# Patient Record
Sex: Male | Born: 1950 | Hispanic: No | Smoking: Former smoker
Health system: Southern US, Community
[De-identification: ages and names within clinical notes are randomized; demographics above are authoritative.]

## PROBLEM LIST (undated history)

## (undated) DIAGNOSIS — I10 Essential (primary) hypertension: Secondary | ICD-10-CM

## (undated) HISTORY — PX: CHOLECYSTECTOMY: SHX55

---

## 1999-12-27 ENCOUNTER — Other Ambulatory Visit: Admission: RE | Admit: 1999-12-27 | Discharge: 1999-12-27 | Payer: Self-pay | Admitting: *Deleted

## 2016-12-20 ENCOUNTER — Emergency Department (HOSPITAL_COMMUNITY): Payer: BC Managed Care – PPO

## 2016-12-20 ENCOUNTER — Encounter (HOSPITAL_COMMUNITY): Payer: Self-pay | Admitting: Emergency Medicine

## 2016-12-20 ENCOUNTER — Emergency Department (HOSPITAL_COMMUNITY)
Admission: EM | Admit: 2016-12-20 | Discharge: 2016-12-20 | Disposition: A | Discharge status: Home / Self Care | Payer: BC Managed Care – PPO | Attending: Emergency Medicine | Admitting: Emergency Medicine

## 2016-12-20 DIAGNOSIS — I10 Essential (primary) hypertension: Secondary | ICD-10-CM | POA: Insufficient documentation

## 2016-12-20 DIAGNOSIS — R61 Generalized hyperhidrosis: Secondary | ICD-10-CM | POA: Insufficient documentation

## 2016-12-20 DIAGNOSIS — Z79899 Other long term (current) drug therapy: Secondary | ICD-10-CM | POA: Insufficient documentation

## 2016-12-20 DIAGNOSIS — R200 Anesthesia of skin: Secondary | ICD-10-CM | POA: Diagnosis not present

## 2016-12-20 DIAGNOSIS — R251 Tremor, unspecified: Secondary | ICD-10-CM | POA: Insufficient documentation

## 2016-12-20 DIAGNOSIS — R55 Syncope and collapse: Secondary | ICD-10-CM | POA: Diagnosis present

## 2016-12-20 DIAGNOSIS — Z7982 Long term (current) use of aspirin: Secondary | ICD-10-CM | POA: Insufficient documentation

## 2016-12-20 HISTORY — DX: Essential (primary) hypertension: I10

## 2016-12-20 LAB — BASIC METABOLIC PANEL
Anion gap: 8 (ref 5–15)
BUN: 15 mg/dL (ref 6–20)
CALCIUM: 9.1 mg/dL (ref 8.9–10.3)
CO2: 26 mmol/L (ref 22–32)
CREATININE: 1.53 mg/dL — AB (ref 0.61–1.24)
Chloride: 100 mmol/L — ABNORMAL LOW (ref 101–111)
GFR, EST AFRICAN AMERICAN: 54 mL/min — AB (ref >60)
GFR, EST NON AFRICAN AMERICAN: 46 mL/min — AB (ref >60)
Glucose, Bld: 101 mg/dL — ABNORMAL HIGH (ref 65–99)
Potassium: 4.3 mmol/L (ref 3.5–5.1)
SODIUM: 134 mmol/L — AB (ref 135–145)

## 2016-12-20 LAB — CBC WITH DIFFERENTIAL/PLATELET
BASOS ABS: 0.1 10*3/uL (ref 0.0–0.1)
BASOS PCT: 1 %
EOS ABS: 0.2 10*3/uL (ref 0.0–0.7)
Eosinophils Relative: 4 %
HCT: 44.5 % (ref 39.0–52.0)
HEMOGLOBIN: 15.5 g/dL (ref 13.0–17.0)
Lymphocytes Relative: 30 %
Lymphs Abs: 1.7 10*3/uL (ref 0.7–4.0)
MCH: 31.9 pg (ref 26.0–34.0)
MCHC: 34.8 g/dL (ref 30.0–36.0)
MCV: 91.6 fL (ref 78.0–100.0)
MONOS PCT: 10 %
Monocytes Absolute: 0.6 10*3/uL (ref 0.1–1.0)
NEUTROS PCT: 55 %
Neutro Abs: 3.3 10*3/uL (ref 1.7–7.7)
Platelets: 221 10*3/uL (ref 150–400)
RBC: 4.86 MIL/uL (ref 4.22–5.81)
RDW: 13.7 % (ref 11.5–15.5)
WBC: 5.9 10*3/uL (ref 4.0–10.5)

## 2016-12-20 LAB — URINALYSIS, ROUTINE W REFLEX MICROSCOPIC
BILIRUBIN URINE: NEGATIVE
Glucose, UA: NEGATIVE mg/dL
HGB URINE DIPSTICK: NEGATIVE
Ketones, ur: NEGATIVE mg/dL
Leukocytes, UA: NEGATIVE
Nitrite: NEGATIVE
PROTEIN: NEGATIVE mg/dL
Specific Gravity, Urine: 1.008 (ref 1.005–1.030)
pH: 6 (ref 5.0–8.0)

## 2016-12-20 LAB — CBG MONITORING, ED: GLUCOSE-CAPILLARY: 101 mg/dL — AB (ref 65–99)

## 2016-12-20 LAB — TROPONIN I

## 2016-12-20 LAB — ETHANOL

## 2016-12-20 MED ORDER — SODIUM CHLORIDE 0.9 % IV BOLUS (SEPSIS)
1000.0000 mL | Freq: Once | INTRAVENOUS | Status: AC
  Administered 2016-12-20: 1000 mL via INTRAVENOUS

## 2016-12-20 MED ORDER — SODIUM CHLORIDE 0.9 % IV SOLN: INTRAVENOUS | Status: DC

## 2016-12-20 NOTE — Discharge Instructions (Signed)
Stop hydrochlorothiazide

## 2016-12-20 NOTE — ED Triage Notes (Signed)
Pt here from MD office with c/o near syncopal episode no chest pain or sob , cbg 102, pt had a full syncopal this past sat night , no c/o pain at this time

## 2016-12-20 NOTE — ED Provider Notes (Signed)
[facility]-EMERGENCY DEPT Provider Note   CSN: [id number] Arrival date & time: 12/20/16  1052     History   Chief Complaint Chief Complaint  Patient presents with  . Near Syncope    HPI George Chaney is a 66 y.o. male.  Pt presents to the ED today with a syncopal episode that occurred a few days ago and a near syncopal episode that occurred today.  The pt said that his full syncopal episode occurred after coughing.  Today, he woke up and did not feel well.  He said he started sweating.  He had a flight to take today and got ready and drove to the airport.  While there, he sweated through his shirt.  He was also incontinent of urine (not usual for pt).  He said he continued to feel worse and worse and decided not to get on the plane.  Instead, he drove to his doctor's office.  His doctor called EMS.  The pt said he feels ok now.  He does note that his pcp has been trying to get his bp under control.  His meds were recently doubled and a fluid pill was ordered.  The pt has been urinating a lot since the fluid pill was ordered.  Pt also notes that he's had some tingling to his left 4th and 5th fingers, but that has been going on for awhile.  Pt's brother just had a CABG a few weeks ago.  Pt's wife has noticed that he's had some twitching on his right leg.      Past Medical History:  Diagnosis Date  . Hypertension     There are no active problems to display for this patient.   Past Surgical History:  Procedure Laterality Date  . CHOLECYSTECTOMY         Home Medications    Prior to Admission medications   Medication Sig Start Date End Date Taking? Authorizing Provider  amitriptyline (ELAVIL) 25 MG tablet Take 25 mg by mouth at bedtime. 12/01/16  Yes [provider]  aspirin EC 81 MG tablet Take 81 mg by mouth every morning.   Yes [provider]  atorvastatin (LIPITOR) 10 MG tablet Take 10 mg by mouth every morning. 11/11/16  Yes [provider]    budesonide-formoterol (SYMBICORT) 80-4.5 MCG/ACT inhaler Inhale 2 puffs into the lungs 2 (two) times daily as needed (shortness of breath).   Yes [provider]  escitalopram (LEXAPRO) 10 MG tablet Take 10 mg by mouth every morning. 10/18/16  Yes [provider]  esomeprazole (NEXIUM) 40 MG capsule Take 80 mg by mouth every morning. 11/17/16  Yes [provider]  fenofibrate (TRICOR) 48 MG tablet Take 48 mg by mouth every morning. 12/11/16  Yes [provider]  fluticasone (FLONASE) 50 MCG/ACT nasal spray Place 2 sprays into both nostrils daily as needed for allergies or rhinitis.   Yes [provider]  hydrochlorothiazide (HYDRODIURIL) 25 MG tablet Take 25 mg by mouth every morning. 11/14/16  Yes [provider]  montelukast (SINGULAIR) 10 MG tablet Take 10 mg by mouth at bedtime. 11/17/16  Yes [provider]  ramipril (ALTACE) 10 MG capsule Take 10 mg by mouth every morning. 10/27/16  Yes [provider]  testosterone (ANDROGEL) 50 MG/5GM (1%) GEL Place 2 g onto the skin every morning. 09/29/16  Yes [provider]  tolterodine (DETROL LA) 4 MG 24 hr capsule Take 4 mg by mouth daily. 11/30/16  Yes [provider]  albuterol (PROVENTIL HFA;VENTOLIN HFA) 108 (90 Base) MCG/ACT inhaler Inhale 2 puffs into the lungs every 6 (six) hours as needed for wheezing or shortness of breath.    [provider]    Family History History reviewed. No pertinent family history.  Social History Social History  Substance Use Topics  . Smoking status: Former Games developer  . Smokeless tobacco: Never Used  . Alcohol use Yes     Allergies   Bee venom   Review of Systems Review of Systems  Constitutional: Positive for diaphoresis.  Neurological: Positive for tremors, syncope and numbness.  All other systems reviewed and are negative.    Physical Exam Updated Vital Signs BP (!) 154/84   Pulse 71   Resp 18   SpO2  99%   Physical Exam  Constitutional: He is oriented to person, place, and time. He appears well-developed and well-nourished.  HENT:  Head: Normocephalic and atraumatic.  Right Ear: External ear normal.  Left Ear: External ear normal.  Nose: Nose normal.  Mouth/Throat: Oropharynx is clear and moist.  Eyes: Pupils are equal, round, and reactive to light. Conjunctivae and EOM are normal.  Neck: Normal range of motion. Neck supple.  Cardiovascular: Normal rate, regular rhythm, normal heart sounds and intact distal pulses.   Pulmonary/Chest: Effort normal and breath sounds normal.  Abdominal: Soft. Bowel sounds are normal.  Musculoskeletal: Normal range of motion.  Neurological: He is alert and oriented to person, place, and time.  Skin: Skin is warm.  Psychiatric: He has a normal mood and affect. His behavior is normal. Judgment and thought content normal.  Nursing note and vitals reviewed.    ED Treatments / Results  Labs (all labs ordered are listed, but only abnormal results are displayed) Labs Reviewed  BASIC METABOLIC PANEL - Abnormal; Notable for the following:       Result Value   Sodium 134 (*)    Chloride 100 (*)    Glucose, Bld 101 (*)    Creatinine, Ser 1.53 (*)    GFR calc non Af Amer 46 (*)    GFR calc Af Amer 54 (*)    All other components within normal limits  URINALYSIS, ROUTINE W REFLEX MICROSCOPIC - Abnormal; Notable for the following:    Color, Urine STRAW (*)    All other components within normal limits  CBG MONITORING, ED - Abnormal; Notable for the following:    Glucose-Capillary 101 (*)    All other components within normal limits  CBC WITH DIFFERENTIAL/PLATELET  TROPONIN I  ETHANOL    EKG  EKG Interpretation  Date/Time:  Tuesday December 20 2016 11:07:20 EDT Ventricular Rate:  72 PR Interval:  154 QRS Duration: 92 QT Interval:  384 QTC Calculation: 420 R Axis:   -2 Text Interpretation:  Normal sinus rhythm Normal ECG Confirmed by Jacalyn Lefevre [phone]) on 12/20/2016 11:13:22 AM       Radiology Dg Chest 2 View  Result Date: 12/20/2016 CLINICAL DATA:  Near syncopal episode. Full syncopal episode 3 nights ago. No chest complaints or shortness of breath. Former smoker. History of hypertension. EXAM: CHEST  2 VIEW COMPARISON:  Report of a chest x-ray of October 15, 2011 FINDINGS: The lungs are adequately inflated. The interstitial markings are mildly prominent. There is no alveolar infiltrate, pleural effusion, or pneumothorax. The heart is top-normal in size. The pulmonary vascularity is not engorged. The mediastinum is normal in width. The bony thorax is unremarkable. IMPRESSION: There is no acute cardiopulmonary abnormality. Coarse interstitial  lung markings likely reflect the patient's smoking history. Electronically Signed   By: David  [place] M.D.   On: 12/20/2016 12:19   Ct Head Wo Contrast  Result Date: 12/20/2016 CLINICAL DATA:  Onset of altered level of consciousness today. EXAM: CT HEAD WITHOUT CONTRAST TECHNIQUE: Contiguous axial images were obtained from the base of the skull through the vertex without intravenous contrast. COMPARISON:  None. FINDINGS: Brain: Appears normal without hemorrhage, infarct, mass lesion, mass effect, midline shift or abnormal extra-axial fluid collection. No hydrocephalus or pneumocephalus. Vascular: Atherosclerosis noted. Skull: Intact Sinuses/Orbits: The patient is status post maxillary antrostomy and ethmoidectomy. Small mucous retention cyst or polyp left maxillary sinus noted. Tiny amount of fluid is seen in the right mastoid air cells. Other: Small area of increased attenuation in the soft tissues of the scalp posteriorly near the vertex on the right may be due to contusion. IMPRESSION: Questions scalp contusion posteriorly near the vertex on the right. Negative for fracture or acute intracranial abnormality. Atherosclerosis. Electronically Signed   By: Drusilla Kanner M.D.   On: 12/20/2016 12:12     Procedures Procedures (including critical care time)  Medications Ordered in ED Medications  sodium chloride 0.9 % bolus 1,000 mL (0 mLs Intravenous Stopped 12/20/16 1308)    And  0.9 %  sodium chloride infusion (not administered)     Initial Impression / Assessment and Plan / ED Course  I have reviewed the triage vital signs and the nursing notes.  Pertinent labs & imaging results that were available during my care of the patient were reviewed by me and considered in my medical decision making (see chart for details).    Pt looks good.  I think sx are from his diuretic.  Pt wants to go home.  Pt needs to f/u with pcp and with a cardiologist.  He is aware he needs a cardiac work up.  Pt knows to return for cp or any worsening of sx.  Final Clinical Impressions(s) / ED Diagnoses   Final diagnoses:  Syncope, unspecified syncope type    New Prescriptions New Prescriptions   No medications on file     Jacalyn Lefevre, MD 12/20/16 1338

## 2016-12-20 NOTE — ED Notes (Signed)
Patient transported to X-ray 

## 2016-12-20 NOTE — ED Notes (Signed)
Pt has said that he has been having some memory loss recently and c/o right hand tingling on the last three fingers

## 2016-12-22 DIAGNOSIS — R55 Syncope and collapse: Secondary | ICD-10-CM | POA: Insufficient documentation

## 2016-12-23 ENCOUNTER — Ambulatory Visit (INDEPENDENT_AMBULATORY_CARE_PROVIDER_SITE_OTHER): Payer: BC Managed Care – PPO | Admitting: Interventional Cardiology

## 2016-12-23 ENCOUNTER — Encounter: Payer: Self-pay | Admitting: Interventional Cardiology

## 2016-12-23 VITALS — BP 136/78 | HR 75 | Ht 70.0 in | Wt 239.6 lb

## 2016-12-23 DIAGNOSIS — R0609 Other forms of dyspnea: Secondary | ICD-10-CM

## 2016-12-23 DIAGNOSIS — R251 Tremor, unspecified: Secondary | ICD-10-CM

## 2016-12-23 DIAGNOSIS — Z7289 Other problems related to lifestyle: Secondary | ICD-10-CM | POA: Insufficient documentation

## 2016-12-23 DIAGNOSIS — R55 Syncope and collapse: Secondary | ICD-10-CM | POA: Diagnosis not present

## 2016-12-23 DIAGNOSIS — Z789 Other specified health status: Secondary | ICD-10-CM

## 2016-12-23 DIAGNOSIS — E782 Mixed hyperlipidemia: Secondary | ICD-10-CM | POA: Diagnosis not present

## 2016-12-23 NOTE — Progress Notes (Signed)
Cardiology Office Note   Date:  12/23/2016   ID:  TRYTON BODI, DOB 09-03-1950, MRN [id number]  PCP:  Malka So., MD    Chief Complaint  Patient presents with  . New Patient (Initial Visit)   syncope  Wt Readings from Last 3 Encounters:  12/23/16 239 lb 9.6 oz (108.7 kg)       History of Present Illness: George Chaney is a 66 y.o. male who is being seen today for the evaluation of syncope at the request of Jobe, Garrison Columbus., MD.   He has HTN.  His BP meds have been increased over the last few weeks due to increased readings.  Last change was HCTZ being added.  After this, he had a syncope spell  4 weeks ago.  He was sitting down at the time on a vacation.  He was in a hot tub with 2 physicians.  He passed out after choking on some alcohol.  A few days ago on Sunday, he had another episode while he was sitting.    He could tell something was not right.  He was under stress.  On Tuesday, he felt hot and got very sweaty while on his way to the airport.  He lost control of his bowels.  He had shaking and tingling.  He decided not to get on the plane. He went to an urgent care.  BP was controlled initially.  BP then increased suddenly.  It was recommended that he go to the hospital.  He went by ambulance to [facility].  He had some short term memory loss as well.  ER told him it was related to BP fluctuation.  HCTZ was stopped.    On other occasions, there is no warning.  He feels that they are more frequent.  Typically, his syncopal episodes are related to coughing spells and have frequently involved alcohol.  THere was an episode 4 years ago as well.    He feels SHOB.  THis is getting worse with exeriton.  His brother had CABG recently.  He gets dizzy with standing quickly.    He has had fatigue and is treated for OSA with CPAP. This was recently readjusted.   Brother had CABG after presenting with syncope.   He does not smoke.  He admits to consuming 3 glasses of wine  daily, but states that he "is not a drunk."    Sex is his most strenuous activity.  He is not on any regular exercise program.     Past Medical History:  Diagnosis Date  . Hypertension     Past Surgical History:  Procedure Laterality Date  . CHOLECYSTECTOMY       Current Outpatient Prescriptions  Medication Sig Dispense Refill  . albuterol (PROVENTIL HFA;VENTOLIN HFA) 108 (90 Base) MCG/ACT inhaler Inhale 2 puffs into the lungs every 6 (six) hours as needed for wheezing or shortness of breath.    Marland Kitchen amitriptyline (ELAVIL) 25 MG tablet Take 25 mg by mouth at bedtime.  11  . aspirin EC 81 MG tablet Take 81 mg by mouth every morning.    Marland Kitchen atorvastatin (LIPITOR) 10 MG tablet Take 10 mg by mouth every morning.  1  . budesonide-formoterol (SYMBICORT) 80-4.5 MCG/ACT inhaler Inhale 2 puffs into the lungs 2 (two) times daily as needed (shortness of breath).    . escitalopram (LEXAPRO) 10 MG tablet Take 10 mg by mouth every morning.  1  . esomeprazole (NEXIUM) 40 MG capsule Take  80 mg by mouth every morning.  1  . fenofibrate (TRICOR) 48 MG tablet Take 48 mg by mouth every morning.  6  . fluticasone (FLONASE) 50 MCG/ACT nasal spray Place 2 sprays into both nostrils daily as needed for allergies or rhinitis.    Marland Kitchen montelukast (SINGULAIR) 10 MG tablet Take 10 mg by mouth at bedtime.  1  . ramipril (ALTACE) 10 MG capsule Take 10 mg by mouth every morning.  3  . testosterone (ANDROGEL) 50 MG/5GM (1%) GEL Place 2 g onto the skin every morning.  0  . tolterodine (DETROL LA) 4 MG 24 hr capsule Take 4 mg by mouth daily.  3   No current facility-administered medications for this visit.     Allergies:   Bee venom    Social History:  The patient  reports that he has quit smoking. He has never used smokeless tobacco. He reports that he drinks alcohol. He reports that he does not use drugs.   Family History:  The patient's family history includes Heart disease in his brother.    ROS:  Please see  the history of present illness.   Otherwise, review of systems are positive for syncope, intermittent tremor of foot.   All other systems are reviewed and negative.    PHYSICAL EXAM: VS:  BP 136/78   Pulse 75   Ht 5\' 10"  (1.778 m)   Wt 239 lb 9.6 oz (108.7 kg)   BMI 34.38 kg/m  , BMI Body mass index is 34.38 kg/m. GEN: Well nourished, well developed, in no acute distress ; markedly reddish complexion HEENT: normal  Neck: no JVD, carotid bruits, or masses Cardiac: RRR; no murmurs, rubs, or gallops,no edema  Respiratory:  clear to auscultation bilaterally, normal work of breathing GI: soft, nontender, nondistended, + BS MS: no deformity or atrophy  Skin: warm and dry, no rash, markedly reddish complexion Neuro:  Strength and sensation are intact Psych: euthymic mood, full affect   EKG:   The ekg ordered today demonstrates NSR, no ST segment changes   Recent Labs: 12/20/2016: BUN 15; Creatinine, Ser 1.53; Hemoglobin 15.5; Platelets 221; Potassium 4.3; Sodium 134   Lipid Panel No results found for: CHOL, TRIG, HDL, CHOLHDL, VLDL, LDLCALC, LDLDIRECT   Other studies Reviewed: Additional studies/ records that were reviewed today with results demonstrating: Mild renal insufficiency based on labs drawn in the ER..   ASSESSMENT AND PLAN:  1. Syncope: Coughing has been a part of most of his syncopal episodes.  He likely gets hypotensive from the straining.  In addition, alcohol has been associated as well. This likely contributes to vasodilation, and hypotension which predisposes to syncope. Check echo to r/o structural heart disease.   He was told not to drive already by urgent care.  2. Alcohol use:  I asked him to cut own on his alcohol use to one glass of wine daily.  Diuretic effect of alcohol may also predispose him to syncope.  Of note, when I asked him routine yes/no questions about tobacco and alcohol, his initial response included admitting to alcohol use but insisting that he  is "not a drunk."   3. DOE:  Plan for nuclear stress test.  He needs a more regular exercise program longterm.  Will check for ischemia. Several RF for CAD including family history, hyperlipidemia and obesity.   4. Tremor: Followup with PCP.  Question whether this is related to alcohol use as well.   5. Hyperlipidemia: COntinue fenofibrate. 6. 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    Current medicines are reviewed at length with the patient today.  The patient concerns regarding his medicines were addressed.  The following changes have been made:  No change  Labs/ tests ordered today include:   Orders Placed This Encounter  Procedures  . Myocardial Perfusion Imaging  . ECHOCARDIOGRAM COMPLETE    Recommend 150 minutes/week of aerobic exercise Low fat, low carb, high fiber diet recommended  Disposition:   FU in based on test results   Signed, Lance Muss, MD  12/23/2016 1:15 PM    [facility] [address] [place], [place], [place]  [address] Phone: [phone]; Fax: [phone]

## 2016-12-23 NOTE — Patient Instructions (Signed)
Medication Instructions:  Your physician recommends that you continue on your current medications as directed. Please refer to the Current Medication list given to you today.   Labwork: None ordered  Testing/Procedures: Your physician has requested that you have an echocardiogram. Echocardiography is a painless test that uses sound waves to create images of your heart. It provides your doctor with information about the size and shape of your heart and how well your heart's chambers and valves are working. This procedure takes approximately one hour. There are no restrictions for this procedure.  Your physician has requested that you have en exercise stress myoview. For further information please visit [email or web address]. Please follow instruction sheet, as given.   Follow-Up: Based on test results   Any Other Special Instructions Will Be Listed Below (If Applicable).     If you need a refill on your cardiac medications before your next appointment, please call your pharmacy.   

## 2016-12-26 ENCOUNTER — Telehealth (HOSPITAL_COMMUNITY): Payer: Self-pay | Admitting: *Deleted

## 2016-12-26 NOTE — Telephone Encounter (Signed)
Patient given detailed instructions per Myocardial Perfusion Study Information Sheet for the test on 12/28/16 Patient notified to arrive 15 minutes early and that it is imperative to arrive on time for appointment to keep from having the test rescheduled.  If you need to cancel or reschedule your appointment, please call the office within 24 hours of your appointment. . Patient verbalized understanding. Ricky Ala, RN

## 2016-12-28 ENCOUNTER — Other Ambulatory Visit: Payer: Self-pay

## 2016-12-28 ENCOUNTER — Ambulatory Visit (HOSPITAL_COMMUNITY): Payer: BC Managed Care – PPO | Attending: Cardiology

## 2016-12-28 ENCOUNTER — Ambulatory Visit (HOSPITAL_BASED_OUTPATIENT_CLINIC_OR_DEPARTMENT_OTHER): Payer: BC Managed Care – PPO

## 2016-12-28 DIAGNOSIS — R55 Syncope and collapse: Secondary | ICD-10-CM

## 2016-12-28 DIAGNOSIS — E785 Hyperlipidemia, unspecified: Secondary | ICD-10-CM | POA: Insufficient documentation

## 2016-12-28 DIAGNOSIS — I080 Rheumatic disorders of both mitral and aortic valves: Secondary | ICD-10-CM | POA: Diagnosis not present

## 2016-12-28 LAB — MYOCARDIAL PERFUSION IMAGING
CHL CUP MPHR: 156 {beats}/min
CHL CUP NUCLEAR SSS: 1
CHL RATE OF PERCEIVED EXERTION: 19
CSEPEW: 10.1 METS
Exercise duration (min): 8 min
LVDIAVOL: 129 mL (ref 62–150)
LVSYSVOL: 60 mL
NUC STRESS TID: 1.03
Peak HR: 142 {beats}/min
Percent HR: 91 %
RATE: 0.4
Rest HR: 65 {beats}/min
SDS: 0
SRS: 1

## 2016-12-28 MED ORDER — TECHNETIUM TC 99M TETROFOSMIN IV KIT
11.0000 | PACK | Freq: Once | INTRAVENOUS | Status: AC | PRN
Start: 2016-12-28 — End: 2016-12-28
  Administered 2016-12-28: 11 via INTRAVENOUS
  Filled 2016-12-28: qty 11

## 2016-12-28 MED ORDER — TECHNETIUM TC 99M TETROFOSMIN IV KIT
32.9000 | PACK | Freq: Once | INTRAVENOUS | Status: AC | PRN
Start: 2016-12-28 — End: 2016-12-28
  Administered 2016-12-28: 32.9 via INTRAVENOUS
  Filled 2016-12-28: qty 33

## 2017-05-15 NOTE — Progress Notes (Signed)
Cardiology Office Note   Date:  05/17/2017   ID:  George Chaney, DOB [birth year], MRN [id number]  PCP:  System, Pcp Not In    No chief complaint on file. HTN   Wt Readings from Last 3 Encounters:  05/17/17 238 lb 12.8 oz (108.3 kg)  12/23/16 239 lb 9.6 oz (108.7 kg)       History of Present Illness: George Chaney is a 67 y.o. male  Who has HTN.  His BP meds have been increased for better BP control.   After this, he had a syncope spell  4 weeks ago.  He was sitting down at the time on a vacation.  He was in a hot tub with 2 physicians.  He passed out after choking on some alcohol.  Coughing had been a part of most of his syncopal episodes.  He likely gets hypotensive from the straining.  In addition, alcohol has been associated as well. This likely contributed to vasodilation, and hypotension which predisposes to syncope.   At the last visit,  I asked him to cut own on his alcohol use to one glass of wine daily.  Diuretic effect of alcohol may also predispose him to syncope.  Of note, when I asked him routine yes/no questions about tobacco and alcohol, his initial response included admitting to alcohol use but insisting that he is "not a drunk."    He had  An echo showing normal LV function and valvular function. Mild pulmonary HTN which may be related to sleep apnea. He had a low risk nuclear stress test.   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  Since his cardiac testing in 8/18, he has still had high BP readings.  Over the holidays, he had some very high BP readings.  He ate a lot of salt at that time as well.  He had readings in the 200 mm Hg.  He was prescribed HCTZ, but did not take it since he had syncope on it before.  He was then prescribed felodipine 2.5 mg but then did not take it wither due to the side effect list.  In early Jan 2019, he eliminated all alcohol use and his BP has come down.  He has readings in the 130-150 systolic range.  He wants to try  to take as little medicine as possible.    He does not exercise.        Past Medical History:  Diagnosis Date  . Hypertension     Past Surgical History:  Procedure Laterality Date  . CHOLECYSTECTOMY       Current Outpatient Medications  Medication Sig Dispense Refill  . albuterol (PROVENTIL HFA;VENTOLIN HFA) 108 (90 Base) MCG/ACT inhaler Inhale 2 puffs into the lungs every 6 (six) hours as needed for wheezing or shortness of breath.    Marland Kitchen amitriptyline (ELAVIL) 25 MG tablet Take 25 mg by mouth at bedtime.  11  . aspirin EC 81 MG tablet Take 81 mg by mouth every morning.    Marland Kitchen atorvastatin (LIPITOR) 10 MG tablet Take 10 mg by mouth every morning.  1  . budesonide-formoterol (SYMBICORT) 80-4.5 MCG/ACT inhaler Inhale 2 puffs into the lungs 2 (two) times daily as needed (shortness of breath).    . EPINEPHrine 0.3 mg/0.3 mL IJ SOAJ injection Inject 0.3 mg into the skin as needed. Take as directed for bee stings    . escitalopram (LEXAPRO) 10 MG tablet Take 10 mg by mouth every morning.  1  . esomeprazole (NEXIUM) 40 MG capsule Take 80 mg by mouth every morning.  1  . fenofibrate (TRICOR) 48 MG tablet Take 48 mg by mouth every morning.  6  . fluticasone (FLONASE) 50 MCG/ACT nasal spray Place 2 sprays into both nostrils daily as needed for allergies or rhinitis.    Marland Kitchen. montelukast (SINGULAIR) 10 MG tablet Take 10 mg by mouth at bedtime.  1  . olmesartan (BENICAR) 40 MG tablet Take 1 tablet by mouth daily.    Marland Kitchen. testosterone (ANDROGEL) 50 MG/5GM (1%) GEL Place 2 g onto the skin every morning.  0  . tolterodine (DETROL LA) 4 MG 24 hr capsule Take 4 mg by mouth daily.  3  . zolpidem (AMBIEN) 10 MG tablet Take 10 mg by mouth as needed.     No current facility-administered medications for this visit.     Allergies:   Bee venom and Hydrochlorothiazide    Social History:  The patient  reports that he has quit smoking. he has never used smokeless tobacco. He reports that he drinks alcohol. He  reports that he does not use drugs.   Family History:  The patient's family history includes Heart disease in his brother.    ROS:  Please see the history of present illness.   Otherwise, review of systems are positive for elevated BP readings.   All other systems are reviewed and negative.    PHYSICAL EXAM: VS:  BP (!) 142/92   Pulse 69   Ht 5\' 10"  (1.778 m)   Wt 238 lb 12.8 oz (108.3 kg)   SpO2 96%   BMI 34.26 kg/m  , BMI Body mass index is 34.26 kg/m. GEN: Well nourished, well developed, in no acute distress  HEENT: normal  Neck: no JVD, carotid bruits, or masses Cardiac: RRR; no murmurs, rubs, or gallops,no edema  Respiratory:  clear to auscultation bilaterally, normal work of breathing GI: soft, nontender, nondistended, + BS, obese MS: no deformity or atrophy  Skin: warm and dry, no rash Neuro:  Strength and sensation are intact Psych: euthymic mood, full affect    Recent Labs: 12/20/2016: BUN 15; Creatinine, Ser 1.53; Hemoglobin 15.5; Platelets 221; Potassium 4.3; Sodium 134   Lipid Panel No results found for: CHOL, TRIG, HDL, CHOLHDL, VLDL, LDLCALC, LDLDIRECT   Other studies Reviewed: Additional studies/ records that were reviewed today with results demonstrating: r 1.53 in 8/18.     ASSESSMENT AND PLAN:  1. Syncope: No further syncope.  Avoiding alcohol.  Now off HCTZ as well. 2. Alcohol use: He has stopped alcohol and this helped his BP.  3. Hyperlipidemia: On fenofibrate. Managed by PMD. 4. HTN: On olmesartan from his PMD.  Would recommend that his electrolytes be monitored as well. Start amlodipine 5 mg daily.  Discussed side effects.  If BP gets lower after better lifestyle, could back off of some meds.  He has f/u with PMD.  Will have him f/u with HTN clinic in 4 months.    Current medicines are reviewed at length with the patient today.  The patient concerns regarding his medicines were addressed.  The following changes have been made:  No  change  Labs/ tests ordered today include:  No orders of the defined types were placed in this encounter.   Recommend 150 minutes/week of aerobic exercise Low fat, low carb, high fiber diet recommended  Disposition:   FU in 4 months   Signed, Lance MussJayadeep Addasyn Mcbreen, MD  05/17/2017 12:10 PM  [facility] [place], [place], [place]  [address] Phone: [phone]; Fax: [phone]

## 2017-05-16 ENCOUNTER — Encounter: Payer: Self-pay | Admitting: Interventional Cardiology

## 2017-05-17 ENCOUNTER — Ambulatory Visit: Payer: BC Managed Care – PPO | Admitting: Interventional Cardiology

## 2017-05-17 ENCOUNTER — Encounter: Payer: Self-pay | Admitting: Interventional Cardiology

## 2017-05-17 VITALS — BP 142/92 | HR 69 | Ht 70.0 in | Wt 238.8 lb

## 2017-05-17 DIAGNOSIS — I10 Essential (primary) hypertension: Secondary | ICD-10-CM | POA: Insufficient documentation

## 2017-05-17 DIAGNOSIS — R55 Syncope and collapse: Secondary | ICD-10-CM

## 2017-05-17 DIAGNOSIS — E782 Mixed hyperlipidemia: Secondary | ICD-10-CM | POA: Diagnosis not present

## 2017-05-17 DIAGNOSIS — Z789 Other specified health status: Secondary | ICD-10-CM

## 2017-05-17 DIAGNOSIS — Z7289 Other problems related to lifestyle: Secondary | ICD-10-CM

## 2017-05-17 MED ORDER — AMLODIPINE BESYLATE 5 MG PO TABS: 5.0000 mg | ORAL_TABLET | Freq: Every day | ORAL | 3 refills | Status: DC

## 2017-05-17 NOTE — Patient Instructions (Signed)
Medication Instructions:  Your physician has recommended you make the following change in your medication:   START: amlodipine 5 mg daily  Labwork: None ordered  Testing/Procedures: None ordered  Follow-Up: Your physician recommends that you schedule a follow-up appointment in: 4 months in the Hypertension Clinic for Blood Pressure Management.    Any Other Special Instructions Will Be Listed Below (If Applicable).   Low-Sodium Eating Plan Sodium, which is an element that makes up salt, helps you maintain a healthy balance of fluids in your body. Too much sodium can increase your blood pressure and cause fluid and waste to be held in your body. Your health care provider or dietitian may recommend following this plan if you have high blood pressure (hypertension), kidney disease, liver disease, or heart failure. Eating less sodium can help lower your blood pressure, reduce swelling, and protect your heart, liver, and kidneys. What are tips for following this plan? General guidelines  Most people on this plan should limit their sodium intake to 1,500-2,000 mg (milligrams) of sodium each day. Reading food labels  The Nutrition Facts label lists the amount of sodium in one serving of the food. If you eat more than one serving, you must multiply the listed amount of sodium by the number of servings.  Choose foods with less than 140 mg of sodium per serving.  Avoid foods with 300 mg of sodium or more per serving. Shopping  Look for lower-sodium products, often labeled as "low-sodium" or "no salt added."  Always check the sodium content even if foods are labeled as "unsalted" or "no salt added".  Buy fresh foods. ? Avoid canned foods and premade or frozen meals. ? Avoid canned, cured, or processed meats  Buy breads that have less than 80 mg of sodium per slice. Cooking  Eat more home-cooked food and less restaurant, buffet, and fast food.  Avoid adding salt when cooking. Use  salt-free seasonings or herbs instead of table salt or sea salt. Check with your health care provider or pharmacist before using salt substitutes.  Cook with plant-based oils, such as canola, sunflower, or olive oil. Meal planning  When eating at a restaurant, ask that your food be prepared with less salt or no salt, if possible.  Avoid foods that contain MSG (monosodium glutamate). MSG is sometimes added to [place] food, bouillon, and some canned foods. What foods are recommended? The items listed may not be a complete list. Talk with your dietitian about what dietary choices are best for you. Grains Low-sodium cereals, including oats, puffed wheat and rice, and shredded wheat. Low-sodium crackers. Unsalted rice. Unsalted pasta. Low-sodium bread. Whole-grain breads and whole-grain pasta. Vegetables Fresh or frozen vegetables. "No salt added" canned vegetables. "No salt added" tomato sauce and paste. Low-sodium or reduced-sodium tomato and vegetable juice. Fruits Fresh, frozen, or canned fruit. Fruit juice. Meats and other protein foods Fresh or frozen (no salt added) meat, poultry, seafood, and fish. Low-sodium canned tuna and salmon. Unsalted nuts. Dried peas, beans, and lentils without added salt. Unsalted canned beans. Eggs. Unsalted nut butters. Dairy Milk. Soy milk. Cheese that is naturally low in sodium, such as ricotta cheese, fresh mozzarella, or Swiss cheese Low-sodium or reduced-sodium cheese. Cream cheese. Yogurt. Fats and oils Unsalted butter. Unsalted margarine with no trans fat. Vegetable oils such as canola or olive oils. Seasonings and other foods Fresh and dried herbs and spices. Salt-free seasonings. Low-sodium mustard and ketchup. Sodium-free salad dressing. Sodium-free light mayonnaise. Fresh or refrigerated horseradish. Lemon juice. Vinegar.  Homemade, reduced-sodium, or low-sodium soups. Unsalted popcorn and pretzels. Low-salt or salt-free chips. What foods are not  recommended? The items listed may not be a complete list. Talk with your dietitian about what dietary choices are best for you. Grains Instant hot cereals. Bread stuffing, pancake, and biscuit mixes. Croutons. Seasoned rice or pasta mixes. Noodle soup cups. Boxed or frozen macaroni and cheese. Regular salted crackers. Self-rising flour. Vegetables Sauerkraut, pickled vegetables, and relishes. Olives. [place] fries. Onion rings. Regular canned vegetables (not low-sodium or reduced-sodium). Regular canned tomato sauce and paste (not low-sodium or reduced-sodium). Regular tomato and vegetable juice (not low-sodium or reduced-sodium). Frozen vegetables in sauces. Meats and other protein foods Meat or fish that is salted, canned, smoked, spiced, or pickled. Bacon, ham, sausage, hotdogs, corned beef, chipped beef, packaged lunch meats, salt pork, jerky, pickled herring, anchovies, regular canned tuna, sardines, salted nuts. Dairy Processed cheese and cheese spreads. Cheese curds. Blue cheese. Feta cheese. String cheese. Regular cottage cheese. Buttermilk. Canned milk. Fats and oils Salted butter. Regular margarine. Ghee. Bacon fat. Seasonings and other foods Onion salt, garlic salt, seasoned salt, table salt, and sea salt. Canned and packaged gravies. Worcestershire sauce. Tartar sauce. Barbecue sauce. Teriyaki sauce. Soy sauce, including reduced-sodium. Steak sauce. Fish sauce. Oyster sauce. Cocktail sauce. Horseradish that you find on the shelf. Regular ketchup and mustard. Meat flavorings and tenderizers. Bouillon cubes. Hot sauce and Tabasco sauce. Premade or packaged marinades. Premade or packaged taco seasonings. Relishes. Regular salad dressings. Salsa. Potato and tortilla chips. Corn chips and puffs. Salted popcorn and pretzels. Canned or dried soups. Pizza. Frozen entrees and pot pies. Summary  Eating less sodium can help lower your blood pressure, reduce swelling, and protect your heart, liver,  and kidneys.  Most people on this plan should limit their sodium intake to 1,500-2,000 mg (milligrams) of sodium each day.  Canned, boxed, and frozen foods are high in sodium. Restaurant foods, fast foods, and pizza are also very high in sodium. You also get sodium by adding salt to food.  Try to cook at home, eat more fresh fruits and vegetables, and eat less fast food, canned, processed, or prepared foods. This information is not intended to replace advice given to you by your health care provider. Make sure you discuss any questions you have with your health care provider. Document Released: 10/08/2001 Document Revised: 04/11/2016 Document Reviewed: 04/11/2016 Elsevier Interactive Patient Education  [employer]2018 [employer].    If you need a refill on your cardiac medications before your next appointment, please call your pharmacy.

## 2017-09-14 ENCOUNTER — Ambulatory Visit (INDEPENDENT_AMBULATORY_CARE_PROVIDER_SITE_OTHER): Payer: Medicare Other | Admitting: Pharmacist

## 2017-09-14 VITALS — BP 130/78 | HR 61

## 2017-09-14 DIAGNOSIS — I10 Essential (primary) hypertension: Secondary | ICD-10-CM | POA: Diagnosis not present

## 2017-09-14 NOTE — Progress Notes (Signed)
Patient ID: George Chaney                 DOB: 02/25/1951                      MRN: [id number]     HPI: George Chaney is a 67 y.o. male referred by Dr. Eldridge Dace to HTN clinic. PMH is significant for HTN. He has a history of syncopal episode after coughing/choking on alcohol. He has been prescribed HCTZ and felodipine, however did not start either medication. In early January 2019, he eliminated alcohol use and his BP had improved significantly to the 130-150 systolic range. No further syncopal episodes after alcohol cessation. He prefers to take as little medication as possible. He does not exercise. His BP was elevated at 142/92 at his office visit in January, and pt was started on amlodipine  daily. He presents today for follow up.  Pt presents today in good spirits. He denies dizziness, blurred vision, falls, or headache. He has been tolerating his amlodipine well and reports large improvement in his BP readings. He has noticed some LEE since starting his amlodipine, however reports that it is not bothersome. The lowest BP reading he has seen at home was 135/75, highest systolic reading in the 160s. He uses ibuprofen occasionally, about 1x every 2 weeks. He took his medications 1.5 hours ago and has had 2 cups of coffee this morning. He does add salt to his food frequently. He has been using his elliptical machine 3x a week.  Current HTN meds: olmesartan  daily, amlodipine  daily Previously tried: HCTZ - syncope BP goal: <130/26mmHg  Family History: The patient's family history includes Heart disease in his brother.   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 The patient  reports that he has quit smoking. he has never used smokeless tobacco. He reports that he drinks alcohol. He reports that he does not use drugs.   Diet: Cutting back on sweets and carbs. Does add salt to food. Drinks 2 cups of coffee each morning. Cut out soft drinks.  Exercise: Purchased a home  elliptical - using 3x a week  Wt Readings from Last 3 Encounters:  05/17/17 238 lb 12.8 oz (108.3 kg)  12/23/16 239 lb 9.6 oz (108.7 kg)   BP Readings from Last 3 Encounters:  05/17/17 (!) 142/92  12/23/16 136/78  12/20/16 (!) 154/84   Pulse Readings from Last 3 Encounters:  05/17/17 69  12/23/16 75  12/20/16 71    Renal function: CrCl cannot be calculated (Patient's most recent lab result is older than the maximum 21 days allowed.).  Past Medical History:  Diagnosis Date  . Hypertension     Current Outpatient Medications on File Prior to Visit  Medication Sig Dispense Refill  . albuterol (PROVENTIL HFA;VENTOLIN HFA) 108 (90 Base) MCG/ACT inhaler Inhale 2 puffs into the lungs every 6 (six) hours as needed for wheezing or shortness of breath.    Marland Kitchen amitriptyline (ELAVIL) 25 MG tablet Take 25 mg by mouth at bedtime.  11  . amLODipine (NORVASC) 5 MG tablet Take 1 tablet (5 mg total) by mouth daily. 180 tablet 3  . aspirin EC 81 MG tablet Take 81 mg by mouth every morning.    Marland Kitchen atorvastatin (LIPITOR) 10 MG tablet Take 10 mg by mouth every morning.  1  . budesonide-formoterol (SYMBICORT) 80-4.5 MCG/ACT inhaler Inhale 2 puffs into the lungs 2 (two) times daily as needed (shortness of  breath).    . EPINEPHrine 0.3 mg/0.3 mL IJ SOAJ injection Inject 0.3 mg into the skin as needed. Take as directed for bee stings    . escitalopram (LEXAPRO) 10 MG tablet Take 10 mg by mouth every morning.  1  . esomeprazole (NEXIUM) 40 MG capsule Take 80 mg by mouth every morning.  1  . fenofibrate (TRICOR) 48 MG tablet Take 48 mg by mouth every morning.  6  . fluticasone (FLONASE) 50 MCG/ACT nasal spray Place 2 sprays into both nostrils daily as needed for allergies or rhinitis.    Marland Kitchen montelukast (SINGULAIR) 10 MG tablet Take 10 mg by mouth at bedtime.  1  . olmesartan (BENICAR) 40 MG tablet Take 1 tablet by mouth daily.    Marland Kitchen testosterone (ANDROGEL) 50 MG/5GM (1%) GEL Place 2 g onto the skin every  morning.  0  . tolterodine (DETROL LA) 4 MG 24 hr capsule Take 4 mg by mouth daily.  3  . zolpidem (AMBIEN) 10 MG tablet Take 10 mg by mouth as needed.     No current facility-administered medications on file prior to visit.     Allergies  Allergen Reactions  . Bee Venom Anaphylaxis and Swelling  . Hydrochlorothiazide Other (See Comments)    Patient goes into shock     Assessment/Plan:  1. Hypertension - BP controlled today at goal <130/63mmHg in each arm. Will continue olmesartan  daily and amlodipine  daily. Encouraged pt to look for salt substitute as he has been adding regular salt to most of his food. Advised pt to contact clinic if his home BP readings become consistently elevated >130/20mmHg or if LEE worsens or becomes bothersome on amlodipine. F/u in HTN clinic as needed.   Megan E. Supple, PharmD, CPP, BCACP [facility] [address] [place], [place] [address] Phone: [phone]; Fax: [phone] 09/14/2017 9:35 AM

## 2017-09-14 NOTE — Patient Instructions (Signed)
Look for George Chaney salt substitute

## 2017-09-30 IMAGING — NM NM MISC PROCEDURE
6 series · 36 of 36 positions shown · non-contrast
Comparison: none

[Series 1: rest · 6.51mm/px · 6 of 64 frames shown]
[frame 6/64]
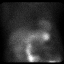
[frame 16/64]
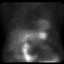
[frame 27/64]
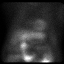
[frame 38/64]
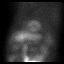
[frame 48/64]
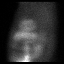
[frame 59/64]
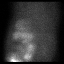

[Series 1: wbr_r-proj_st rest · 6.51mm/px · 6 of 64 frames shown]
[frame 6/64]
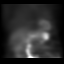
[frame 16/64]
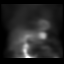
[frame 27/64]
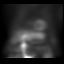
[frame 38/64]
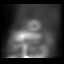
[frame 48/64]
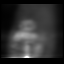
[frame 59/64]
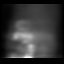

[Series 2: stress · 6.51mm/px · 6 of 64 frames shown (1 of 2)]
[frame 6/64]
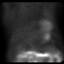
[frame 16/64]
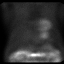
[frame 27/64]
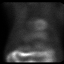
[frame 38/64]
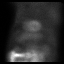
[frame 48/64]
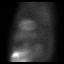
[frame 59/64]
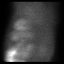

[Series 2: wbr_s-proj_st stress · 6.51mm/px · 6 of 64 frames shown (1 of 2)]
[frame 6/64]
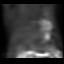
[frame 16/64]
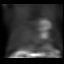
[frame 27/64]
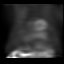
[frame 38/64]
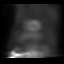
[frame 48/64]
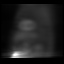
[frame 59/64]
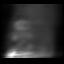

[Series 2: stress · 6.51mm/px · 6 of 480 frames shown (2 of 2)]
[frame 41/480  full-range]
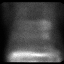
[frame 121/480  full-range]
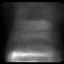
[frame 201/480  full-range]
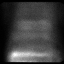
[frame 281/480  full-range]
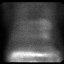
[frame 361/480  full-range]
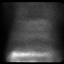
[frame 441/480  full-range]
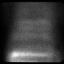

[Series 2: wbr_s-proj_st stress · 6.51mm/px · 6 of 512 frames shown (2 of 2)]
[frame 43/512]
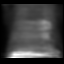
[frame 128/512]
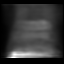
[frame 214/512]
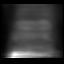
[frame 299/512]
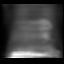
[frame 384/512]
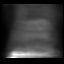
[frame 470/512]
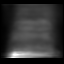

[36 of 36 positions shown; findings below may reference images not displayed]

Canned report from images found in remote index.

Refer to host system for actual result text.

## 2018-08-10 ENCOUNTER — Other Ambulatory Visit: Payer: Self-pay | Admitting: Interventional Cardiology

## 2019-05-26 ENCOUNTER — Ambulatory Visit: Payer: Medicare PPO | Attending: Internal Medicine

## 2019-05-26 DIAGNOSIS — Z23 Encounter for immunization: Secondary | ICD-10-CM | POA: Insufficient documentation

## 2019-05-26 NOTE — Progress Notes (Signed)
   Covid-19 Vaccination Clinic  Name:  KORY RAINS    MRN: [id number] DOB: [birth year]  05/26/2019  Mr. Eckerson was observed post Covid-19 immunization for 15 minutes without incidence. He was provided with Vaccine Information Sheet and instruction to access the V-Safe system.   Mr. Kataoka was instructed to call 911 with any severe reactions post vaccine: Marland Kitchen Difficulty breathing  . Swelling of your face and throat  . A fast heartbeat  . A bad rash all over your body  . Dizziness and weakness    Immunizations Administered    Name Date Dose VIS Date Route   Pfizer COVID-19 Vaccine 05/26/2019  3:08 PM 0.3 mL 04/12/2019 Intramuscular   Manufacturer: [employer], [employer]   Lot: [id number]   NDC: [address]-1

## 2019-06-13 ENCOUNTER — Ambulatory Visit: Payer: Medicare PPO | Attending: Internal Medicine

## 2019-06-13 DIAGNOSIS — Z23 Encounter for immunization: Secondary | ICD-10-CM | POA: Insufficient documentation

## 2019-06-13 NOTE — Progress Notes (Signed)
   Covid-19 Vaccination Clinic  Name:  George Chaney    MRN: [id number] DOB: 04/09/1951  06/13/2019  Mr. [name] was observed post Covid-19 immunization for 15 minutes without incidence. He was provided with Vaccine Information Sheet and instruction to access the V-Safe system.   Mr. [name] was instructed to call 911 with any severe reactions post vaccine: Marland Kitchen Difficulty breathing  . Swelling of your face and throat  . A fast heartbeat  . A bad rash all over your body  . Dizziness and weakness    Immunizations Administered    Name Date Dose VIS Date Route   Pfizer COVID-19 Vaccine 06/13/2019  1:03 PM 0.3 mL 04/12/2019 Intramuscular   Manufacturer: [employer], [employer]   Lot: [id number]   NDC: [address]-9

## 2019-11-05 ENCOUNTER — Other Ambulatory Visit: Payer: Self-pay

## 2019-11-05 MED ORDER — AMLODIPINE BESYLATE 5 MG PO TABS: 5.0000 mg | ORAL_TABLET | Freq: Every day | ORAL | 0 refills | Status: DC

## 2019-11-07 ENCOUNTER — Other Ambulatory Visit: Payer: Self-pay

## 2019-11-07 MED ORDER — AMLODIPINE BESYLATE 5 MG PO TABS: 5.0000 mg | ORAL_TABLET | Freq: Every day | ORAL | 0 refills | Status: DC

## 2019-11-11 ENCOUNTER — Other Ambulatory Visit: Payer: Self-pay

## 2019-11-11 MED ORDER — AMLODIPINE BESYLATE 5 MG PO TABS: 5.0000 mg | ORAL_TABLET | Freq: Every day | ORAL | 0 refills | Status: AC
